# Patient Record
Sex: Male | Born: 1990 | Race: White | Hispanic: No | Marital: Single | Smoking: Never smoker
Health system: Southern US, Community
[De-identification: ages and names within clinical notes are randomized; demographics above are authoritative.]

## PROBLEM LIST (undated history)

## (undated) DIAGNOSIS — C61 Malignant neoplasm of prostate: Secondary | ICD-10-CM

## (undated) DIAGNOSIS — N486 Induration penis plastica: Secondary | ICD-10-CM

## (undated) DIAGNOSIS — K635 Polyp of colon: Secondary | ICD-10-CM

## (undated) DIAGNOSIS — G4730 Sleep apnea, unspecified: Secondary | ICD-10-CM

## (undated) HISTORY — DX: Malignant neoplasm of prostate: C61

## (undated) HISTORY — DX: Induration penis plastica: N48.6

## (undated) HISTORY — PX: SHOULDER ARTHROSCOPY: SHX128

## (undated) HISTORY — DX: Sleep apnea, unspecified: G47.30

## (undated) HISTORY — PX: TONSILLECTOMY: SUR1361

## (undated) HISTORY — DX: Polyp of colon: K63.5

---
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
## 2020-05-27 ENCOUNTER — Encounter: Payer: Self-pay | Admitting: Emergency Medicine

## 2020-05-27 ENCOUNTER — Ambulatory Visit
Admission: EM | Admit: 2020-05-27 | Discharge: 2020-05-27 | Disposition: A | Discharge status: Home / Self Care | Payer: Self-pay | Attending: Physician Assistant | Admitting: Physician Assistant

## 2020-05-27 ENCOUNTER — Other Ambulatory Visit: Payer: Self-pay

## 2020-05-27 DIAGNOSIS — N50811 Right testicular pain: Secondary | ICD-10-CM

## 2020-05-27 DIAGNOSIS — N50812 Left testicular pain: Secondary | ICD-10-CM

## 2020-05-27 LAB — POCT URINALYSIS DIP (MANUAL ENTRY)
Bilirubin, UA: NEGATIVE
Blood, UA: NEGATIVE
Glucose, UA: NEGATIVE mg/dL
Leukocytes, UA: NEGATIVE
Nitrite, UA: NEGATIVE
Protein Ur, POC: NEGATIVE mg/dL
Spec Grav, UA: >=1.03 — AB (ref 1.010–1.025)
Urobilinogen, UA: 0.2 E.U./dL
pH, UA: 5.5 (ref 5.0–8.0)

## 2020-05-27 NOTE — Discharge Instructions (Signed)
No alarming signs on exam.  No signs of swelling, infection, fluid buildup.  Warm compresses at this time, and follow-up with PCP for further evaluation if symptoms not improving.  If having worsening pain, swelling, redness, warmth, go to the emergency department for further evaluation.

## 2020-05-27 NOTE — ED Provider Notes (Signed)
[facility]    CSN: [id number] Arrival date & time: 05/27/20  1740      History   Chief Complaint Chief Complaint  Patient presents with  . Groin Pain    HPI Bradley Hart is a 29 y.o. male.   29 year old male comes in for 3-day history of testicular pain.  States this started on the right testicle, and felt discomfort, especially when sitting.  Now with similar feeling to the left as well.  Denies any injury/trauma.  Denies testicular swelling, redness, warmth.  Notices pain with palpation, sitting.  Mild urinary frequency without dysuria, hematuria.  Denies penile discharge, penile lesion.  Not currently sexually active, last sexual activity a few years ago.  Patient denied history of colon polyps, Peyronie's disease, prostate cancer, sleep apnea. However, correct address to the chart with phone number.      Past Medical History:  Diagnosis Date  . Colon polyps   . Peyronie's disease   . Prostate cancer ([place])   . Sleep apnea     There are no problems to display for this patient.   Past Surgical History:  Procedure Laterality Date  . SHOULDER ARTHROSCOPY    . TONSILLECTOMY         Home Medications    Prior to Admission medications   Medication Sig Start Date End Date Taking? Authorizing Provider  esomeprazole (NEXIUM) 40 MG capsule Take 40 mg by mouth daily before breakfast.  05/27/20  [provider]  levothyroxine (SYNTHROID, LEVOTHROID) 50 MCG tablet Take 50 mcg by mouth daily.  05/27/20  [provider]  pravastatin (PRAVACHOL) 40 MG tablet Take 40 mg by mouth daily.  05/27/20  [provider]    Family History History reviewed. No pertinent family history.  Social History Social History   Tobacco Use  . Smoking status: Never Smoker  . Smokeless tobacco: Never Used  Substance Use Topics  . Alcohol use: Not Currently  . Drug use: Never     Allergies   Patient has no known allergies.   Review of  Systems Review of Systems  Reason unable to perform ROS: See HPI as above.     Physical Exam Triage Vital Signs ED Triage Vitals [05/27/20 1753]  Enc Vitals Group     BP (!) 135/91     Pulse Rate 88     Resp 18     Temp 98.6 F (37 C)     Temp Source Oral     SpO2 97 %     Weight      Height      Head Circumference      Peak Flow      Pain Score 3     Pain Loc      Pain Edu?      Excl. in [place]?    No data found.  Updated Vital Signs BP (!) 135/91 (BP Location: Left Arm)   Pulse 88   Temp 98.6 F (37 C) (Oral)   Resp 18   SpO2 97%   Physical Exam Exam conducted with a chaperone present.  Constitutional:      General: He is not in acute distress.    Appearance: Normal appearance. He is well-developed. He is not toxic-appearing or diaphoretic.  HENT:     Head: Normocephalic and atraumatic.  Eyes:     Conjunctiva/sclera: Conjunctivae normal.     Pupils: Pupils are equal, round, and reactive to light.  Pulmonary:  Effort: Pulmonary effort is normal. No respiratory distress.     Comments: Speaking in full sentences without difficulty Abdominal:     Hernia: There is no hernia in the left inguinal area or right inguinal area.  Genitourinary:    Penis: Circumcised.      Comments: No tenderness to palpation of the testicles. No erythema, warmth, swelling. No tenderness to epididymis. +cremasteric reflex.  Musculoskeletal:     Cervical back: Normal range of motion and neck supple.  Skin:    General: Skin is warm and dry.  Neurological:     Mental Status: He is alert and oriented to person, place, and time.     UC Treatments / Results  Labs (all labs ordered are listed, but only abnormal results are displayed) Labs Reviewed  POCT URINALYSIS DIP (MANUAL ENTRY) - Abnormal; Notable for the following components:      Result Value   Ketones, POC UA trace (5) (*)    Spec Grav, UA >=1.030 (*)    All other components within normal limits     EKG   Radiology No results found.  Procedures Procedures (including critical care time)  Medications Ordered in UC Medications - No data to display  Initial Impression / Assessment and Plan / UC Course  I have reviewed the triage vital signs and the nursing notes.  Pertinent labs & imaging results that were available during my care of the patient were reviewed by me and considered in my medical decision making (see chart for details).    No alarming signs on exam.  Testicular exam nontender, no erythema, warmth, swelling.  No signs of epididymitis.  Positive cremasteric reflex.  Urine without blood, leukocytes, nitrites.  Discussed to do warm compresses for now, and monitor symptoms.  To follow-up with PCP for further evaluation and management needed.  Strict return precautions given.  Patient expresses understanding and agrees to plan.  As discussed above, some chart discrepancies.  Patient denies any history of prostate cancer.  He will review charts via the [place], and update PMH.  Final Clinical Impressions(s) / UC Diagnoses   Final diagnoses:  Pain in both testicles   ED Prescriptions    None     PDMP not reviewed this encounter.   Ok Edwards, PA-C 05/28/20 [phone]

## 2020-05-27 NOTE — ED Triage Notes (Signed)
Pt presents to [facility] for assessment of right testicular pain starting 3 days which was a discomofort at first, but has not spread to the left and has become more severe.

## 2020-05-27 NOTE — ED Notes (Signed)
Patient able to ambulate independently  

## 2020-06-14 ENCOUNTER — Emergency Department (HOSPITAL_COMMUNITY): Payer: No Typology Code available for payment source

## 2020-06-14 ENCOUNTER — Emergency Department (HOSPITAL_COMMUNITY)
Admission: EM | Admit: 2020-06-14 | Discharge: 2020-06-14 | Disposition: A | Discharge status: Home / Self Care | Payer: No Typology Code available for payment source | Attending: Emergency Medicine | Admitting: Emergency Medicine

## 2020-06-14 ENCOUNTER — Other Ambulatory Visit: Payer: Self-pay

## 2020-06-14 DIAGNOSIS — S63501A Unspecified sprain of right wrist, initial encounter: Secondary | ICD-10-CM

## 2020-06-14 DIAGNOSIS — S3022XA Contusion of scrotum and testes, initial encounter: Secondary | ICD-10-CM

## 2020-06-14 DIAGNOSIS — Y92410 Unspecified street and highway as the place of occurrence of the external cause: Secondary | ICD-10-CM | POA: Insufficient documentation

## 2020-06-14 DIAGNOSIS — S7012XA Contusion of left thigh, initial encounter: Secondary | ICD-10-CM | POA: Diagnosis not present

## 2020-06-14 DIAGNOSIS — N50819 Testicular pain, unspecified: Secondary | ICD-10-CM | POA: Diagnosis not present

## 2020-06-14 DIAGNOSIS — S7011XA Contusion of right thigh, initial encounter: Secondary | ICD-10-CM | POA: Diagnosis present

## 2020-06-14 DIAGNOSIS — R52 Pain, unspecified: Secondary | ICD-10-CM

## 2020-06-14 DIAGNOSIS — S3994XA Unspecified injury of external genitals, initial encounter: Secondary | ICD-10-CM

## 2020-06-14 DIAGNOSIS — Y999 Unspecified external cause status: Secondary | ICD-10-CM | POA: Diagnosis not present

## 2020-06-14 DIAGNOSIS — Y939 Activity, unspecified: Secondary | ICD-10-CM | POA: Diagnosis not present

## 2020-06-14 DIAGNOSIS — M25531 Pain in right wrist: Secondary | ICD-10-CM | POA: Diagnosis not present

## 2020-06-14 LAB — CBC WITH DIFFERENTIAL/PLATELET
Abs Immature Granulocytes: 0.03 10*3/uL (ref 0.00–0.07)
Basophils Absolute: 0 10*3/uL (ref 0.0–0.1)
Basophils Relative: 0 %
Eosinophils Absolute: 0 10*3/uL (ref 0.0–0.5)
Eosinophils Relative: 0 %
HCT: 47.4 % (ref 39.0–52.0)
Hemoglobin: 16 g/dL (ref 13.0–17.0)
Immature Granulocytes: 0 %
Lymphocytes Relative: 20 %
Lymphs Abs: 1.9 10*3/uL (ref 0.7–4.0)
MCH: 29.7 pg (ref 26.0–34.0)
MCHC: 33.8 g/dL (ref 30.0–36.0)
MCV: 88.1 fL (ref 80.0–100.0)
Monocytes Absolute: 0.8 10*3/uL (ref 0.1–1.0)
Monocytes Relative: 8 %
Neutro Abs: 6.9 10*3/uL (ref 1.7–7.7)
Neutrophils Relative %: 72 %
Platelets: 281 10*3/uL (ref 150–400)
RBC: 5.38 MIL/uL (ref 4.22–5.81)
RDW: 12.1 % (ref 11.5–15.5)
WBC: 9.7 10*3/uL (ref 4.0–10.5)
nRBC: 0 % (ref 0.0–0.2)

## 2020-06-14 LAB — BASIC METABOLIC PANEL
Anion gap: 11 (ref 5–15)
BUN: 14 mg/dL (ref 6–20)
CO2: 22 mmol/L (ref 22–32)
Calcium: 9.8 mg/dL (ref 8.9–10.3)
Chloride: 106 mmol/L (ref 98–111)
Creatinine, Ser: 1.17 mg/dL (ref 0.61–1.24)
GFR calc Af Amer: >60 mL/min (ref >60)
GFR calc non Af Amer: >60 mL/min (ref >60)
Glucose, Bld: 127 mg/dL — ABNORMAL HIGH (ref 70–99)
Potassium: 3.7 mmol/L (ref 3.5–5.1)
Sodium: 139 mmol/L (ref 135–145)

## 2020-06-14 MED ORDER — TRAMADOL HCL 50 MG PO TABS: 50.0000 mg | ORAL_TABLET | Freq: Four times a day (QID) | ORAL | 0 refills | Status: AC | PRN
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
## 2020-06-14 NOTE — Discharge Instructions (Addendum)
Wear splint as applied for the next several days.  Ice for 20 minutes every 2 hours while awake for the next 2 days.  Take ibuprofen 600 mg every 6 hours as needed for pain.  Begin taking tramadol as prescribed as needed for pain not relieved with ibuprofen.  Follow-up with primary doctor if symptoms are not improving in the next week.
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
## 2020-06-15 ENCOUNTER — Encounter (HOSPITAL_COMMUNITY): Payer: Self-pay | Admitting: Emergency Medicine

## 2020-06-15 ENCOUNTER — Emergency Department (HOSPITAL_COMMUNITY): Payer: No Typology Code available for payment source

## 2020-06-15 ENCOUNTER — Other Ambulatory Visit: Payer: Self-pay

## 2020-06-15 ENCOUNTER — Emergency Department (HOSPITAL_COMMUNITY)
Admission: EM | Admit: 2020-06-15 | Discharge: 2020-06-15 | Disposition: A | Discharge status: Home / Self Care | Payer: No Typology Code available for payment source | Attending: Emergency Medicine | Admitting: Emergency Medicine

## 2020-06-15 DIAGNOSIS — S060X0A Concussion without loss of consciousness, initial encounter: Secondary | ICD-10-CM | POA: Diagnosis not present

## 2020-06-15 DIAGNOSIS — R040 Epistaxis: Secondary | ICD-10-CM | POA: Diagnosis not present

## 2020-06-15 DIAGNOSIS — S0990XA Unspecified injury of head, initial encounter: Secondary | ICD-10-CM | POA: Diagnosis present

## 2020-06-15 DIAGNOSIS — Y92410 Unspecified street and highway as the place of occurrence of the external cause: Secondary | ICD-10-CM | POA: Diagnosis not present

## 2020-06-15 DIAGNOSIS — Z041 Encounter for examination and observation following transport accident: Secondary | ICD-10-CM | POA: Diagnosis not present

## 2020-06-15 DIAGNOSIS — Z8546 Personal history of malignant neoplasm of prostate: Secondary | ICD-10-CM | POA: Insufficient documentation

## 2020-06-15 DIAGNOSIS — Y9389 Activity, other specified: Secondary | ICD-10-CM | POA: Diagnosis not present

## 2020-06-15 DIAGNOSIS — Y999 Unspecified external cause status: Secondary | ICD-10-CM | POA: Insufficient documentation

## 2020-06-15 DIAGNOSIS — M791 Myalgia, unspecified site: Secondary | ICD-10-CM | POA: Diagnosis not present

## 2020-06-15 MED ORDER — ACETAMINOPHEN 500 MG PO TABS
1000.0000 mg | ORAL_TABLET | Freq: Once | ORAL | Status: AC
  Administered 2020-06-15: 1000 mg via ORAL
  Filled 2020-06-15: qty 2
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
## 2020-06-26 ENCOUNTER — Telehealth: Payer: Self-pay | Admitting: Family Medicine

## 2020-06-26 NOTE — Telephone Encounter (Signed)
Patient referred by ED provider --cld to scheduled appt unable to spk w/ pt (Msg left) --glh
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
## 2021-08-06 ENCOUNTER — Other Ambulatory Visit: Payer: Self-pay

## 2021-08-06 ENCOUNTER — Emergency Department (HOSPITAL_COMMUNITY)
Admission: EM | Admit: 2021-08-06 | Discharge: 2021-08-07 | Disposition: A | Discharge status: Home / Self Care | Payer: No Typology Code available for payment source | Attending: Emergency Medicine | Admitting: Emergency Medicine

## 2021-08-06 ENCOUNTER — Encounter (HOSPITAL_COMMUNITY): Payer: Self-pay | Admitting: Emergency Medicine

## 2021-08-06 DIAGNOSIS — K5900 Constipation, unspecified: Secondary | ICD-10-CM | POA: Insufficient documentation

## 2021-08-06 DIAGNOSIS — R110 Nausea: Secondary | ICD-10-CM | POA: Diagnosis not present

## 2021-08-06 DIAGNOSIS — R109 Unspecified abdominal pain: Secondary | ICD-10-CM

## 2021-08-06 DIAGNOSIS — R638 Other symptoms and signs concerning food and fluid intake: Secondary | ICD-10-CM | POA: Insufficient documentation

## 2021-08-06 DIAGNOSIS — Z8546 Personal history of malignant neoplasm of prostate: Secondary | ICD-10-CM | POA: Insufficient documentation

## 2021-08-06 LAB — COMPREHENSIVE METABOLIC PANEL
ALT: 18 U/L (ref 0–44)
AST: 25 U/L (ref 15–41)
Albumin: 4.8 g/dL (ref 3.5–5.0)
Alkaline Phosphatase: 59 U/L (ref 38–126)
Anion gap: 9 (ref 5–15)
BUN: 7 mg/dL (ref 6–20)
CO2: 25 mmol/L (ref 22–32)
Calcium: 9.9 mg/dL (ref 8.9–10.3)
Chloride: 103 mmol/L (ref 98–111)
Creatinine, Ser: 0.99 mg/dL (ref 0.61–1.24)
GFR, Estimated: >60 mL/min (ref >60)
Glucose, Bld: 87 mg/dL (ref 70–99)
Potassium: 5.1 mmol/L (ref 3.5–5.1)
Sodium: 137 mmol/L (ref 135–145)
Total Bilirubin: 1.2 mg/dL (ref 0.3–1.2)
Total Protein: 7.5 g/dL (ref 6.5–8.1)

## 2021-08-06 LAB — LIPASE, BLOOD: Lipase: 33 U/L (ref 11–51)

## 2021-08-06 NOTE — ED Triage Notes (Signed)
Patient states he has been having a strange sensation in right upper abdominal pain, with some nausea.  He states that it started on Wednesday, felt like muscle strain and now it has progressively gotten worse.  No vomiting.

## 2021-08-07 ENCOUNTER — Emergency Department (HOSPITAL_COMMUNITY): Payer: No Typology Code available for payment source

## 2021-08-07 LAB — URINALYSIS, ROUTINE W REFLEX MICROSCOPIC
Bilirubin Urine: NEGATIVE
Glucose, UA: NEGATIVE mg/dL
Hgb urine dipstick: NEGATIVE
Ketones, ur: 15 mg/dL — AB
Leukocytes,Ua: NEGATIVE
Nitrite: NEGATIVE
Protein, ur: NEGATIVE mg/dL
Specific Gravity, Urine: 1.015 (ref 1.005–1.030)
pH: 6 (ref 5.0–8.0)

## 2021-08-07 LAB — CBC
HCT: 47.6 % (ref 39.0–52.0)
Hemoglobin: 16 g/dL (ref 13.0–17.0)
MCH: 30 pg (ref 26.0–34.0)
MCHC: 33.6 g/dL (ref 30.0–36.0)
MCV: 89.1 fL (ref 80.0–100.0)
Platelets: 251 10*3/uL (ref 150–400)
RBC: 5.34 MIL/uL (ref 4.22–5.81)
RDW: 12.7 % (ref 11.5–15.5)
WBC: 7.1 10*3/uL (ref 4.0–10.5)
nRBC: 0 % (ref 0.0–0.2)

## 2021-08-07 MED ORDER — IBUPROFEN 400 MG PO TABS
600.0000 mg | ORAL_TABLET | Freq: Once | ORAL | Status: AC
  Administered 2021-08-07: 600 mg via ORAL
  Filled 2021-08-07: qty 1

## 2021-08-07 MED ORDER — POLYETHYLENE GLYCOL 3350 17 G PO PACK: 17.0000 g | PACK | Freq: Every day | ORAL | 0 refills | Status: AC

## 2021-08-07 MED ORDER — PSYLLIUM 58.6 % PO PACK: 1.0000 | PACK | Freq: Every day | ORAL | 12 refills | Status: AC

## 2021-08-07 NOTE — ED Notes (Signed)
Got patient into a gown on the monitor patient is resting with call bell in reach 

## 2021-08-07 NOTE — ED Notes (Signed)
Hospital doctor at the bedside

## 2021-08-07 NOTE — ED Notes (Signed)
Patient is back from MRI breakfast tray given

## 2021-08-07 NOTE — ED Notes (Signed)
Trying a PO challenge at this time.

## 2021-08-07 NOTE — ED Notes (Signed)
Able to eat all snacks without vomiting, 600 mg ibuprofen given

## 2021-08-07 NOTE — ED Notes (Signed)
ED Provider at bedside. 
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
## 2022-07-04 IMAGING — US US ABDOMEN LIMITED
1 series · 14 of 25 positions shown · non-contrast
Comparison: None.

CLINICAL DATA: Right upper quadrant pain.

EXAM:
ULTRASOUND ABDOMEN LIMITED RIGHT UPPER QUADRANT

[Series 1: us abdomen limited · 14 of 44 slices shown]
[im 1/44]
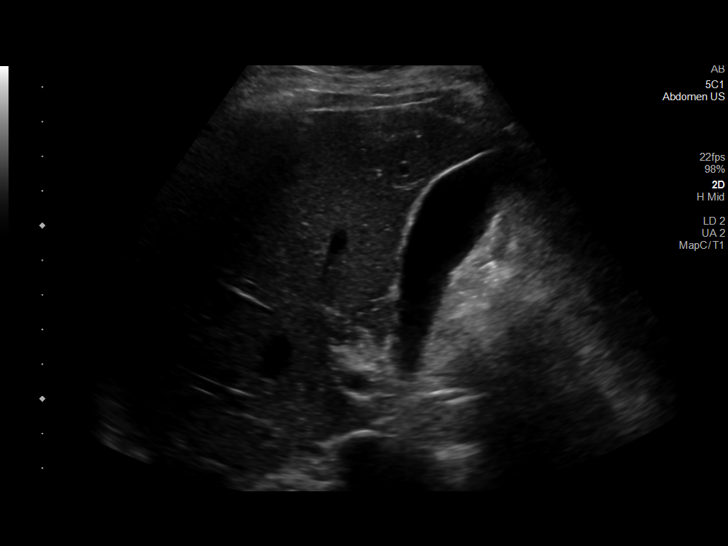
[im 4/44]
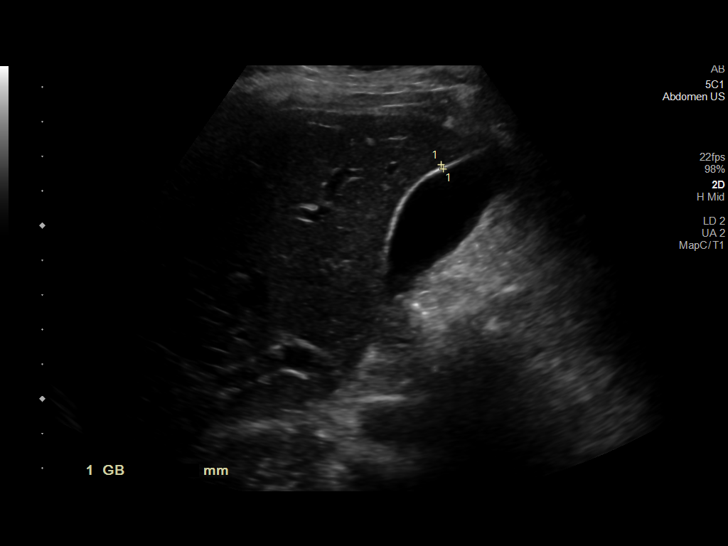
[im 8/44]
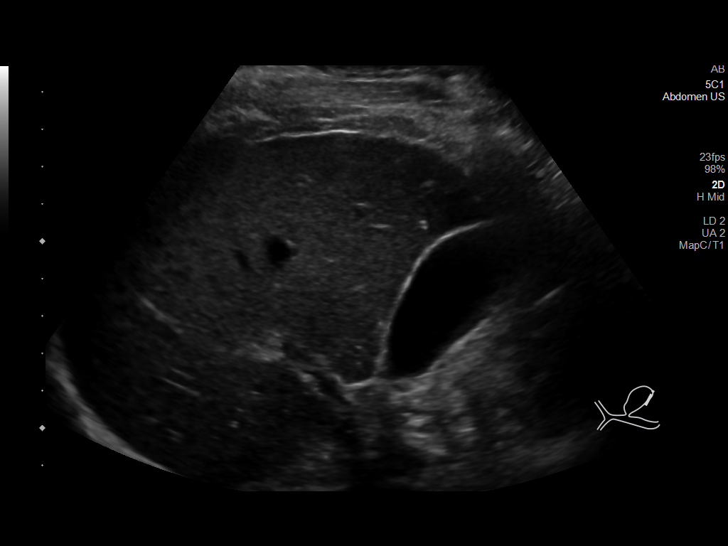
[im 11/44]
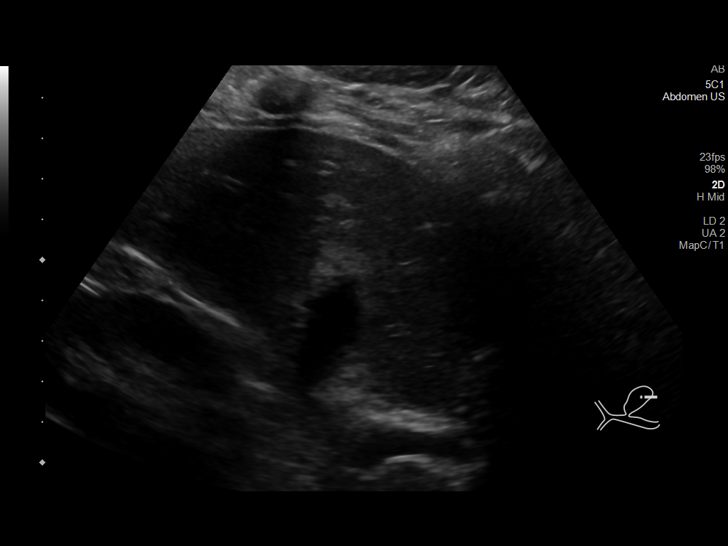
[im 15/44]
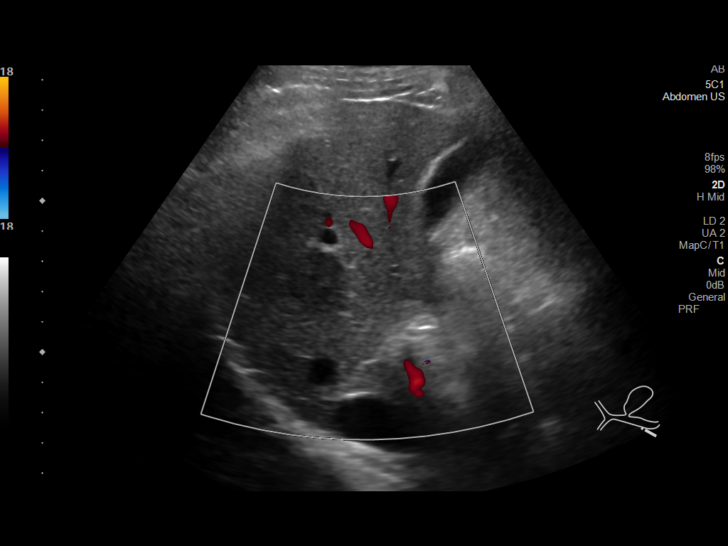
[im 17/44]
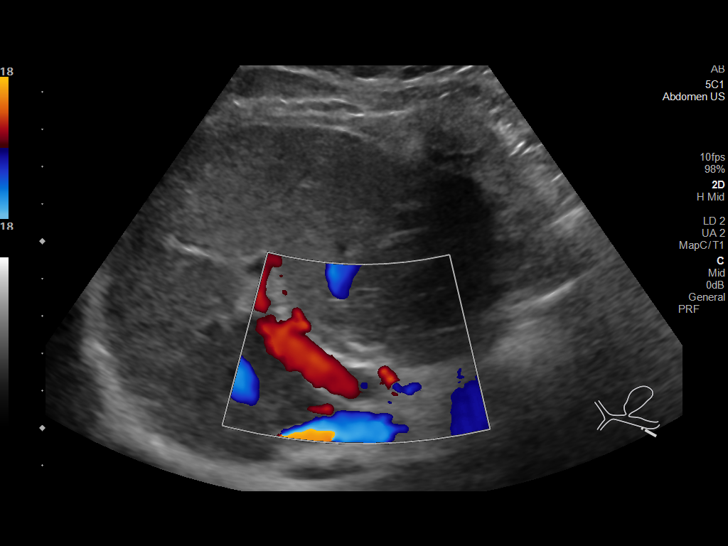
[im 20/44]
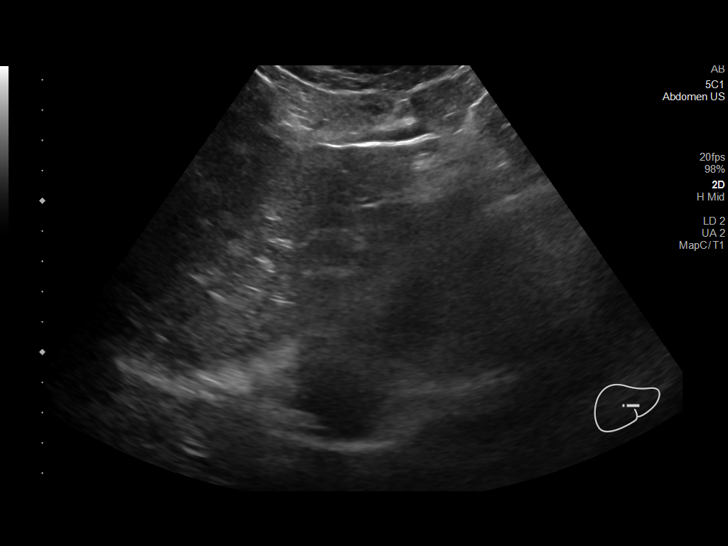
[im 24/44]
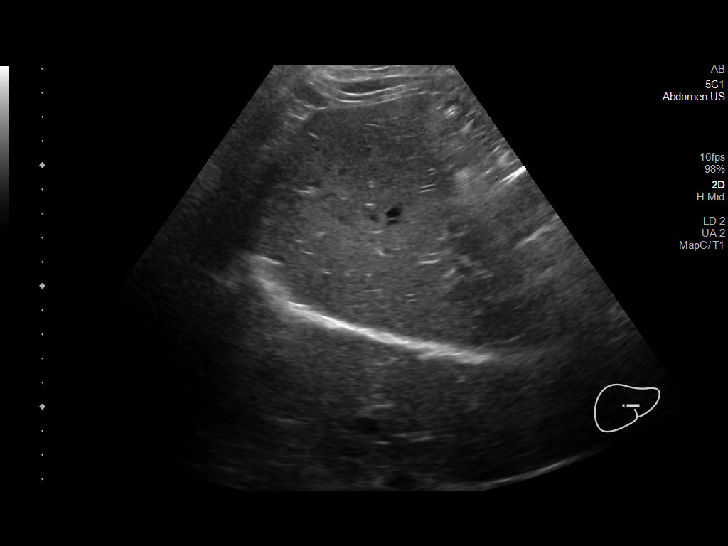
[im 27/44]
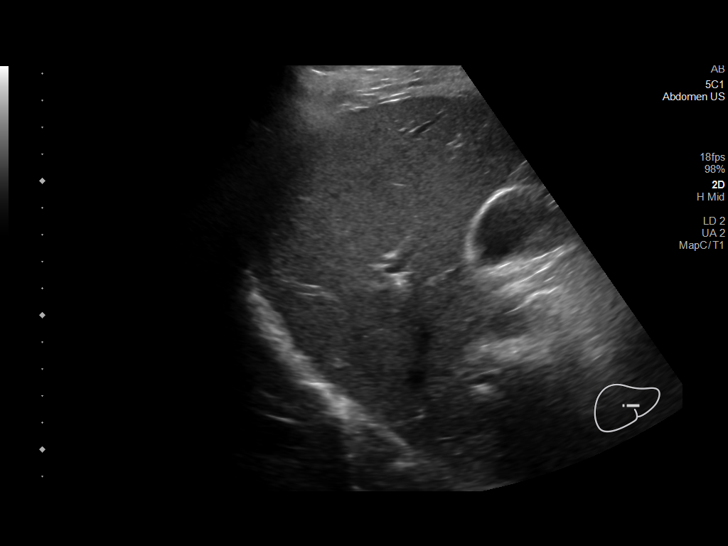
[im 29/44]
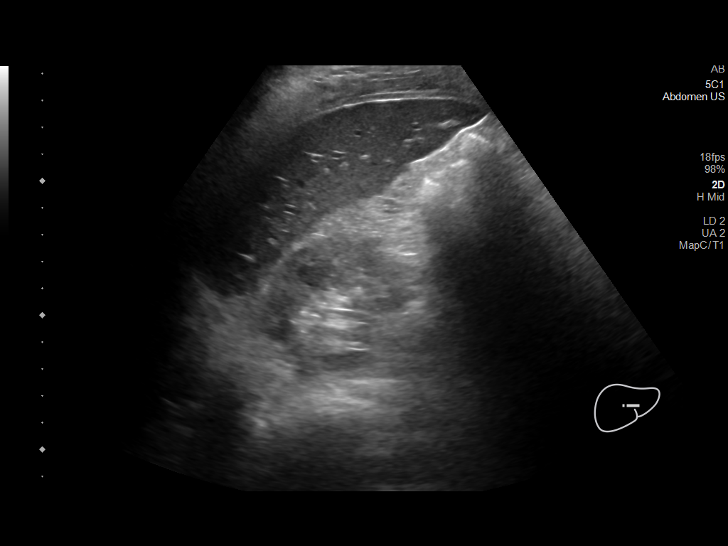
[im 33/44]
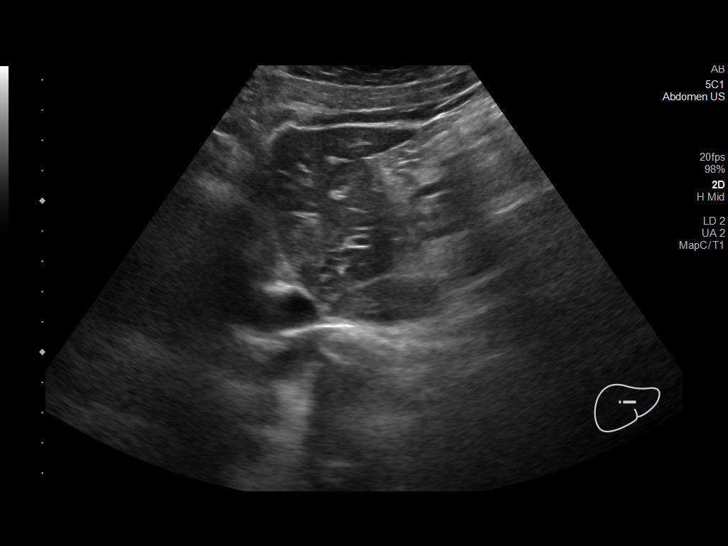
[im 36/44]
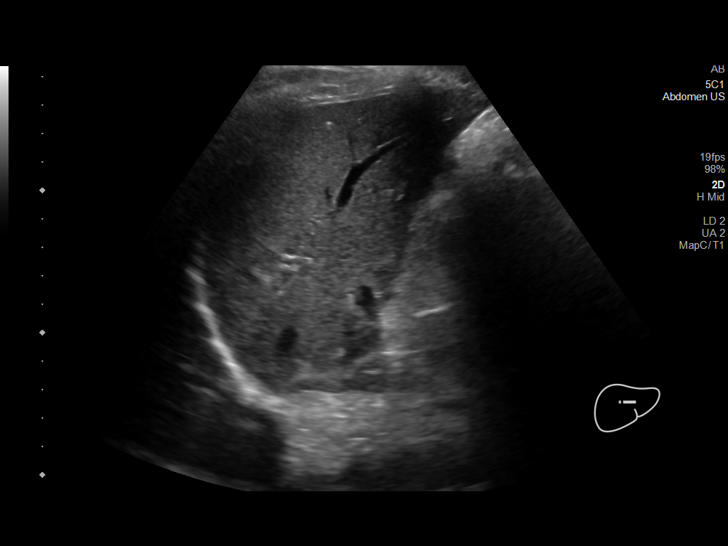
[im 40/44]
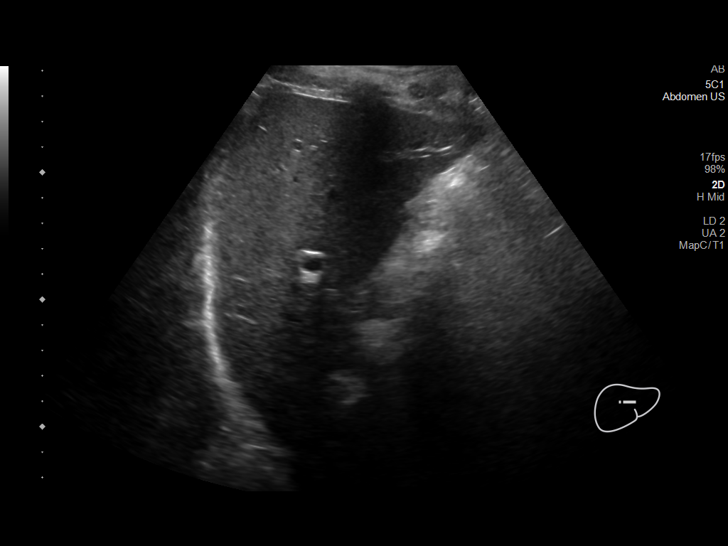
[im 44/44]
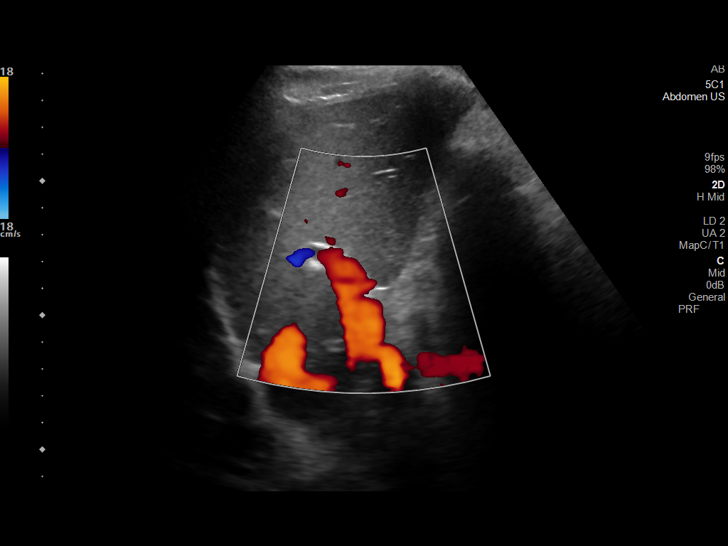

[14 of 25 positions shown; findings below may reference images not displayed]

FINDINGS: Gallbladder:

No gallstones or wall thickening visualized. No sonographic Murphy
sign noted by sonographer.

Common bile duct:

Diameter: Normal, 2 mm.

Liver:

No focal lesion identified. Within normal limits in parenchymal
echogenicity. Portal vein is patent on color Doppler imaging with
normal direction of blood flow towards the liver.

Other: None.
IMPRESSION: No acute process or explanation for right upper quadrant pain.

## 2023-03-13 ENCOUNTER — Encounter: Payer: Self-pay | Admitting: *Deleted
